# Patient Record
Sex: Male | Born: 1975 | Race: White | Hispanic: No | Marital: Married | Smoking: Former smoker
Health system: Southern US, Community
[De-identification: ages and names within clinical notes are randomized; demographics above are authoritative.]

## PROBLEM LIST (undated history)

## (undated) DIAGNOSIS — Z789 Other specified health status: Secondary | ICD-10-CM

## (undated) HISTORY — DX: Other specified health status: Z78.9

## (undated) HISTORY — PX: MANDIBLE FRACTURE SURGERY: SHX706

---

## 2008-08-18 ENCOUNTER — Emergency Department: Payer: Self-pay | Admitting: Internal Medicine

## 2008-10-13 ENCOUNTER — Emergency Department: Payer: Self-pay | Admitting: Emergency Medicine

## 2017-06-07 ENCOUNTER — Emergency Department (HOSPITAL_COMMUNITY): Payer: BLUE CROSS/BLUE SHIELD

## 2017-06-07 ENCOUNTER — Observation Stay (HOSPITAL_COMMUNITY)
Admission: EM | Admit: 2017-06-07 | Discharge: 2017-06-08 | Disposition: A | Discharge status: Home / Self Care | Payer: BLUE CROSS/BLUE SHIELD | Attending: Internal Medicine | Admitting: Internal Medicine

## 2017-06-07 ENCOUNTER — Observation Stay (HOSPITAL_COMMUNITY): Payer: BLUE CROSS/BLUE SHIELD

## 2017-06-07 ENCOUNTER — Other Ambulatory Visit: Payer: Self-pay

## 2017-06-07 ENCOUNTER — Encounter (HOSPITAL_COMMUNITY): Payer: Self-pay | Admitting: *Deleted

## 2017-06-07 DIAGNOSIS — R531 Weakness: Secondary | ICD-10-CM | POA: Insufficient documentation

## 2017-06-07 DIAGNOSIS — F17228 Nicotine dependence, chewing tobacco, with other nicotine-induced disorders: Secondary | ICD-10-CM | POA: Diagnosis not present

## 2017-06-07 DIAGNOSIS — R110 Nausea: Secondary | ICD-10-CM | POA: Insufficient documentation

## 2017-06-07 DIAGNOSIS — F1210 Cannabis abuse, uncomplicated: Secondary | ICD-10-CM | POA: Insufficient documentation

## 2017-06-07 DIAGNOSIS — R002 Palpitations: Secondary | ICD-10-CM | POA: Diagnosis present

## 2017-06-07 DIAGNOSIS — R61 Generalized hyperhidrosis: Secondary | ICD-10-CM | POA: Diagnosis not present

## 2017-06-07 DIAGNOSIS — D696 Thrombocytopenia, unspecified: Secondary | ICD-10-CM

## 2017-06-07 DIAGNOSIS — Z87891 Personal history of nicotine dependence: Secondary | ICD-10-CM | POA: Diagnosis not present

## 2017-06-07 DIAGNOSIS — R079 Chest pain, unspecified: Principal | ICD-10-CM | POA: Diagnosis present

## 2017-06-07 DIAGNOSIS — R0789 Other chest pain: Secondary | ICD-10-CM | POA: Diagnosis not present

## 2017-06-07 LAB — I-STAT TROPONIN, ED: TROPONIN I, POC: 0 ng/mL (ref 0.00–0.08)

## 2017-06-07 LAB — INFLUENZA PANEL BY PCR (TYPE A & B)
INFLAPCR: NEGATIVE
INFLBPCR: NEGATIVE

## 2017-06-07 LAB — BASIC METABOLIC PANEL
ANION GAP: 8 (ref 5–15)
BUN: 10 mg/dL (ref 6–20)
CALCIUM: 9.2 mg/dL (ref 8.9–10.3)
CO2: 26 mmol/L (ref 22–32)
Chloride: 104 mmol/L (ref 101–111)
Creatinine, Ser: 1.06 mg/dL (ref 0.61–1.24)
Glucose, Bld: 101 mg/dL — ABNORMAL HIGH (ref 65–99)
Potassium: 4 mmol/L (ref 3.5–5.1)
SODIUM: 138 mmol/L (ref 135–145)

## 2017-06-07 LAB — CBC
HCT: 46.9 % (ref 39.0–52.0)
HEMOGLOBIN: 16.9 g/dL (ref 13.0–17.0)
MCH: 31.1 pg (ref 26.0–34.0)
MCHC: 36 g/dL (ref 30.0–36.0)
MCV: 86.2 fL (ref 78.0–100.0)
Platelets: 146 10*3/uL — ABNORMAL LOW (ref 150–400)
RBC: 5.44 MIL/uL (ref 4.22–5.81)
RDW: 12.4 % (ref 11.5–15.5)
WBC: 8.2 10*3/uL (ref 4.0–10.5)

## 2017-06-07 LAB — RAPID URINE DRUG SCREEN, HOSP PERFORMED
Amphetamines: NOT DETECTED
BENZODIAZEPINES: NOT DETECTED
Barbiturates: NOT DETECTED
COCAINE: NOT DETECTED
OPIATES: NOT DETECTED
Tetrahydrocannabinol: POSITIVE — AB

## 2017-06-07 LAB — TROPONIN I: Troponin I: <0.03 ng/mL (ref <0.03)

## 2017-06-07 LAB — TSH: TSH: 1.49 u[IU]/mL (ref 0.350–4.500)

## 2017-06-07 MED ORDER — ASPIRIN EC 81 MG PO TBEC
81.0000 mg | DELAYED_RELEASE_TABLET | Freq: Every day | ORAL | Status: DC
  Administered 2017-06-08: 81 mg via ORAL
  Filled 2017-06-07: qty 1

## 2017-06-07 MED ORDER — ENOXAPARIN SODIUM 40 MG/0.4ML ~~LOC~~ SOLN: 40.0000 mg | SUBCUTANEOUS | Status: DC

## 2017-06-07 MED ORDER — ENOXAPARIN SODIUM 100 MG/ML ~~LOC~~ SOLN
90.0000 mg | SUBCUTANEOUS | Status: DC
  Administered 2017-06-07: 90 mg via SUBCUTANEOUS
  Filled 2017-06-07: qty 1

## 2017-06-07 MED ORDER — ACETAMINOPHEN 325 MG PO TABS: 650.0000 mg | ORAL_TABLET | Freq: Four times a day (QID) | ORAL | Status: DC | PRN

## 2017-06-07 MED ORDER — ACETAMINOPHEN 650 MG RE SUPP: 650.0000 mg | Freq: Four times a day (QID) | RECTAL | Status: DC | PRN

## 2017-06-07 NOTE — H&P (Signed)
Date: 06/07/2017               Patient Name:  Tom Thomas MRN: [id number]  DOB: Sep 21, 1975 Age / Sex: 41 y.o., male   PCP: [facility]         Medical Service: Internal Medicine Teaching Service         Attending Physician: Dr. Gust RungHoffman, Erik C, DO    First Contact: Dr. Minda MeoLaCroce Pager: [phone]  Second Contact: Dr. Samuella CotaSvalina Pager: [phone]       After Hours (After 5p/  First Contact Pager: [phone]  weekends / holidays): Second Contact Pager: [phone]   Chief Complaint: chest pain   History of Present Illness:  41 yo male with no PMH presenting with a chief complaint of chest pain.   Patient endorses 5 day history of feeling generally weak, with lack of energy, and fatigue. Weakness and fatigue is worse with exertion. He is usually very active, but during this period of time, he has barely left the house and could not go to work this morning due to the profound weakness after just taking a shower. He also endorses feeling of heart fluttering with some intermittent substernal chest pain. He endorses chills since onset of these symptoms, also with intermittent nausea (one episode of vomiting), dizziness with positional changes, and decreased appetite (thinks he lost ~10lbs since onset of symptoms). He denies weight loss prior to symptoms onset - usual weight is ~198lbs). Episodes of palpitations, nausea, dizziness with start up and resolve suddenly. He denies fever, abd pain, diarrhea, constipation, focal weakness, numbness/tingling, upper respiratory symptoms, myalgias, urinary frequency, urgency, hematuria, headaches, vision or hearing changes. He denies recent ill contacts.  He denies known family history of heart disease; he is a former smoker (15 pack years - quit 423yrs ago); occasional EtOH use; occasional marijuana use.   ED course: Vitals: BP 103/69, HR 60, afebrile, O2sat 98% Labs: CBC remarkable for plts 149, Bmet remarkable for glu 101, istat trop  neg Meds: asa 325 prior to arrival Imaging: CXR unremarkable, EKG w/o ischemic changes.  Meds:  Current Meds  Medication Sig  . aspirin 325 MG tablet Take 325 mg by mouth daily.  . Multiple Vitamins-Minerals (MULTIVITAMIN WITH MINERALS) tablet Take 1 tablet by mouth daily.   Allergies: Allergies as of 06/07/2017  . (No Known Allergies)   History reviewed. No pertinent past medical history.  Family History: Both parents died of drug overdose (father age 2330s, mother 550s).   Social History: Married, former 15 pack year smoker (quit 143yrs ago), occasional EtOH use, occasional marijuana use  Review of Systems: A complete ROS was negative except as per HPI.   Physical Exam: Blood pressure 105/76, pulse (!) 56, temperature (!) 97.5 F (36.4 C), temperature source Oral, resp. rate 12, SpO2 95 %. GENERAL- alert, co-operative, appears as stated age, not in any distress. HEENT- Atraumatic, normocephalic, EOMI, oral mucosa appears moist; no thyromegaly, no lymphadenopathy CARDIAC- RRR, no murmurs, rubs or gallops. RESP- Moving equal volumes of air, and clear to auscultation bilaterally, no wheezes or crackles. ABDOMEN- Soft, nontender, bowel sounds present. NEURO- No obvious Cr N abnormality. Moves all 4 extremities freely EXTREMITIES- pulse 2+, symmetric, no pedal edema. SKIN- Warm, dry, no rash or lesion. PSYCH- Normal mood and affect, appropriate thought content and speech.  EKG: personally reviewed my interpretation is SR, no ST segment depression or elevation, no T wave inversions  CXR: personally reviewed my interpretation is no pulmonary congestion, infiltrates,  pleural effusion  Assessment & Plan by Problem: Principal Problem:   Palpitation Active Problems:   Chest pain  Palpitations, weakness, nausea: Initially, his presentation sounds like a viral illness with sudden onset 5 days ago, however new onset, symptomatic palpitations are concerning. He has no other s/sx of  cathecholamine access (no abd pain, diarrhea, headaches). His BP was soft and in setting of reported poor PO intake, he may be slightly volume down despite reassuring labs. --telemetry to monitor for arrhythmia --f/u orthostatic VS --trend trops --f/u TSH, flu panel --f/u Echo  Atypical chest pain: Intermittent, mild, at times sharp, left sided chest pain sometimes associated with exertion. Heart score of 1. EKG and initial iStat troponin negative. --s/p ASA 325mg  --trend troponins --f/u Echo --f/u AM lipid panel  Dispo: Admit patient to Observation with expected length of stay less than 2 midnights.  Signed: Nyra MarketSvalina, Michah Minton, MD 06/07/2017, 1:20 PM  Pager: [phone]

## 2017-06-07 NOTE — Progress Notes (Signed)
Patient admitted to 6E10 from ED via stretcher.  Bed in low position, wheels locked.  Patient denies chest pain/shortness of breath.  Telemetry monitor applied.  Patient oriented to environment, including call bell, TV, meal times, and hourly rounding.  Family at bedside.  Droplet precautions initiated per order--awaiting RVP results. Explained to family.

## 2017-06-07 NOTE — ED Provider Notes (Signed)
[facility] Provider Note   CSN: [id number] Arrival date & time: 06/07/17  [id number]     History   Chief Complaint Chief Complaint  Patient presents with  . Chest Pain    HPI Tom Thomas is a 41 y.o. male.  Complains of left anterior chest pain nonradiating onset 5 days ago discomfort is intermittent feels dull and "like a bubbling, symptoms worse with exertion and improved with rest associated symptoms include nausea sweatiness and generalized weakness.  He is presently asymptomatic.  Treated himself with aspirin 325 mg this morning he was seen in urgent care center last week had blood work which he reports was "normal" referred to his family physician who suggested that he come to the emergency department for further evaluation.  HPI Past medical history negative History reviewed. No pertinent past medical history.  There are no active problems to display for this patient.   Past Surgical History:  Procedure Laterality Date  . MANDIBLE FRACTURE SURGERY         Home Medications    Prior to Admission medications   Not on File    Family History No family history on file. Family history unknown to patient his father died at age 41 in a motor vehicle crash he did not know his mother well Social History Social History   Tobacco Use  . Smoking status: Former Games developermoker  . Smokeless tobacco: Current User    Types: Chew  Substance Use Topics  . Alcohol use: Not on file    Comment: light  . Drug use: Yes    Types: Marijuana   Smoking 5 years ago positive marijuana use no other drug use  Allergies   Patient has no known allergies.   Review of Systems Review of Systems  Constitutional: Positive for diaphoresis.  HENT: Negative.   Respiratory: Positive for shortness of breath.   Cardiovascular: Positive for chest pain.  Gastrointestinal: Positive for nausea.  Musculoskeletal: Negative.   Skin: Negative.   Neurological:  Positive for weakness.       Generalized weakness  Psychiatric/Behavioral: Negative.   All other systems reviewed and are negative.    Physical Exam Updated Vital Signs BP 112/81 (BP Location: Right Arm)   Pulse 74   Temp (!) 97.5 F (36.4 C) (Oral)   Resp 18   SpO2 99%   Physical Exam  Constitutional: He appears well-developed and well-nourished.  HENT:  Head: Normocephalic and atraumatic.  Eyes: Conjunctivae are normal. Pupils are equal, round, and reactive to light.  Neck: Neck supple. No tracheal deviation present. No thyromegaly present.  Cardiovascular: Normal rate and regular rhythm.  No murmur heard. Pulmonary/Chest: Effort normal and breath sounds normal.  Abdominal: Soft. Bowel sounds are normal. He exhibits no distension. There is no tenderness.  Musculoskeletal: Normal range of motion. He exhibits no edema or tenderness.  Neurological: He is alert. Coordination normal.  Skin: Skin is warm and dry. No rash noted.  Psychiatric: He has a normal mood and affect.  Nursing note and vitals reviewed.    ED Treatments / Results  Labs (all labs ordered are listed, but only abnormal results are displayed) Labs Reviewed  BASIC METABOLIC PANEL  CBC  I-STAT TROPONIN, ED    EKG  EKG Interpretation  Date/Time:  Wednesday June 07 2017 09:28:46 EST Ventricular Rate:  74 PR Interval:  114 QRS Duration: 88 QT Interval:  360 QTC Calculation: 399 R Axis:   65 Text Interpretation:  Normal sinus  rhythm with sinus arrhythmia Normal ECG No old tracing to compare Confirmed by JustinJacubowitz, Doreatha MartinSam [phone]([id number]) on 06/07/2017 9:44:39 AM       Radiology No results found.  Procedures Procedures (including critical care time)  Medications Ordered in ED Medications - No data to display Chest x-ray reviewed by me Results for orders placed or performed during the hospital encounter of 06/07/17  Basic metabolic panel  Result Value Ref Range   Sodium 138 135 - 145 mmol/L    Potassium 4.0 3.5 - 5.1 mmol/L   Chloride 104 101 - 111 mmol/L   CO2 26 22 - 32 mmol/L   Glucose, Bld 101 (H) 65 - 99 mg/dL   BUN 10 6 - 20 mg/dL   Creatinine, Ser 7.821.06 0.61 - 1.24 mg/dL   Calcium 9.2 8.9 - 95.610.3 mg/dL   GFR calc non Af Amer >60 >60 mL/min   GFR calc Af Amer >60 >60 mL/min   Anion gap 8 5 - 15  CBC  Result Value Ref Range   WBC 8.2 4.0 - 10.5 K/uL   RBC 5.44 4.22 - 5.81 MIL/uL   Hemoglobin 16.9 13.0 - 17.0 g/dL   HCT 21.346.9 08.639.0 - 57.852.0 %   MCV 86.2 78.0 - 100.0 fL   MCH 31.1 26.0 - 34.0 pg   MCHC 36.0 30.0 - 36.0 g/dL   RDW 46.912.4 62.911.5 - 52.815.5 %   Platelets 146 (L) 150 - 400 K/uL  I-stat troponin, ED  Result Value Ref Range   Troponin i, poc 0.00 0.00 - 0.08 ng/mL   Comment 3           Dg Chest 2 View  Result Date: 06/07/2017 CLINICAL DATA:  Chest pain EXAM: CHEST  2 VIEW COMPARISON:  None. FINDINGS: Heart and mediastinal contours are within normal limits. No focal opacities or effusions. No acute bony abnormality. IMPRESSION: No active cardiopulmonary disease. Electronically Signed   By: Charlett NoseKevin  Dover M.D.   On: 06/07/2017 10:35  Chest x-ray reviewed by me  Initial Impression / Assessment and Plan / ED Course  I have reviewed the triage vital signs and the nursing notes.  Pertinent labs & imaging results that were available during my care of the patient were reviewed by me and considered in my medical decision making (see chart for details).    Heart score equals 2 however patient has convincing story for angina.  Internal medicine resident physician consulted to arrange for overnight stay Final Clinical Impressions(s) / ED Diagnoses  Diagnosis exertional chest pain Final diagnoses:  None    ED Discharge Orders    None       Doug SouJacubowitz, Arlinda Barcelona, MD 06/07/17 1148

## 2017-06-07 NOTE — ED Notes (Signed)
ED Provider at bedside. 

## 2017-06-07 NOTE — ED Notes (Signed)
Patient transported to X-ray 

## 2017-06-07 NOTE — ED Triage Notes (Signed)
Pt states Friday morning got nauseated and weak and has felt this way since.  Pt reports heart fluttering and gets pain at bottom of left chest.

## 2017-06-08 ENCOUNTER — Observation Stay (HOSPITAL_COMMUNITY): Payer: BLUE CROSS/BLUE SHIELD

## 2017-06-08 ENCOUNTER — Encounter (HOSPITAL_COMMUNITY): Payer: Self-pay | Admitting: General Practice

## 2017-06-08 DIAGNOSIS — R002 Palpitations: Secondary | ICD-10-CM

## 2017-06-08 DIAGNOSIS — R0789 Other chest pain: Secondary | ICD-10-CM | POA: Diagnosis not present

## 2017-06-08 DIAGNOSIS — R110 Nausea: Secondary | ICD-10-CM | POA: Diagnosis not present

## 2017-06-08 DIAGNOSIS — R61 Generalized hyperhidrosis: Secondary | ICD-10-CM | POA: Diagnosis not present

## 2017-06-08 DIAGNOSIS — R079 Chest pain, unspecified: Secondary | ICD-10-CM | POA: Diagnosis not present

## 2017-06-08 DIAGNOSIS — R531 Weakness: Secondary | ICD-10-CM | POA: Diagnosis not present

## 2017-06-08 LAB — HEPATIC FUNCTION PANEL
ALT: 14 U/L — ABNORMAL LOW (ref 17–63)
AST: 18 U/L (ref 15–41)
Albumin: 3.9 g/dL (ref 3.5–5.0)
Alkaline Phosphatase: 70 U/L (ref 38–126)
BILIRUBIN DIRECT: 0.1 mg/dL (ref 0.1–0.5)
BILIRUBIN INDIRECT: 1.1 mg/dL — AB (ref 0.3–0.9)
TOTAL PROTEIN: 6.8 g/dL (ref 6.5–8.1)
Total Bilirubin: 1.2 mg/dL (ref 0.3–1.2)

## 2017-06-08 LAB — CBC
HEMATOCRIT: 45.4 % (ref 39.0–52.0)
Hemoglobin: 16.4 g/dL (ref 13.0–17.0)
MCH: 31.2 pg (ref 26.0–34.0)
MCHC: 36.1 g/dL — ABNORMAL HIGH (ref 30.0–36.0)
MCV: 86.5 fL (ref 78.0–100.0)
PLATELETS: 140 10*3/uL — AB (ref 150–400)
RBC: 5.25 MIL/uL (ref 4.22–5.81)
RDW: 12.5 % (ref 11.5–15.5)
WBC: 8.8 10*3/uL (ref 4.0–10.5)

## 2017-06-08 LAB — LIPID PANEL
CHOL/HDL RATIO: 7.3 ratio
Cholesterol: 182 mg/dL (ref 0–200)
HDL: 25 mg/dL — AB (ref >40)
LDL Cholesterol: 120 mg/dL — ABNORMAL HIGH (ref 0–99)
TRIGLYCERIDES: 185 mg/dL — AB (ref <150)
VLDL: 37 mg/dL (ref 0–40)

## 2017-06-08 LAB — MONONUCLEOSIS SCREEN: Mono Screen: NEGATIVE

## 2017-06-08 LAB — BASIC METABOLIC PANEL
ANION GAP: 9 (ref 5–15)
BUN: 10 mg/dL (ref 6–20)
CALCIUM: 8.9 mg/dL (ref 8.9–10.3)
CO2: 24 mmol/L (ref 22–32)
Chloride: 104 mmol/L (ref 101–111)
Creatinine, Ser: 1 mg/dL (ref 0.61–1.24)
GLUCOSE: 98 mg/dL (ref 65–99)
POTASSIUM: 3.9 mmol/L (ref 3.5–5.1)
Sodium: 137 mmol/L (ref 135–145)

## 2017-06-08 LAB — ECHOCARDIOGRAM COMPLETE
Height: 73.5 in
Weight: 2934.4 oz

## 2017-06-08 LAB — HEMOGLOBIN A1C
HEMOGLOBIN A1C: 5 % (ref 4.8–5.6)
Mean Plasma Glucose: 96.8 mg/dL

## 2017-06-08 LAB — HIV ANTIBODY (ROUTINE TESTING W REFLEX): HIV SCREEN 4TH GENERATION: NONREACTIVE

## 2017-06-08 MED ORDER — ONDANSETRON 4 MG PO TBDP: 4.0000 mg | ORAL_TABLET | Freq: Three times a day (TID) | ORAL | Status: DC | PRN

## 2017-06-08 MED ORDER — ONDANSETRON 4 MG PO TBDP
4.0000 mg | ORAL_TABLET | Freq: Once | ORAL | Status: DC
  Filled 2017-06-08: qty 1

## 2017-06-08 MED ORDER — ONDANSETRON HCL 4 MG PO TABS: 4.0000 mg | ORAL_TABLET | Freq: Three times a day (TID) | ORAL | 0 refills | Status: DC | PRN

## 2017-06-08 MED ORDER — ONDANSETRON HCL 4 MG PO TABS: 4.0000 mg | ORAL_TABLET | Freq: Three times a day (TID) | ORAL | 0 refills | Status: AC | PRN

## 2017-06-08 MED ORDER — PERFLUTREN LIPID MICROSPHERE
1.0000 mL | INTRAVENOUS | Status: AC | PRN
  Administered 2017-06-08: 2 mL via INTRAVENOUS

## 2017-06-08 MED ORDER — ASPIRIN 81 MG PO TBEC: 81.0000 mg | DELAYED_RELEASE_TABLET | Freq: Every day | ORAL | Status: AC

## 2017-06-08 MED ORDER — ONDANSETRON 4 MG PO TBDP
4.0000 mg | ORAL_TABLET | Freq: Three times a day (TID) | ORAL | Status: DC | PRN
  Filled 2017-06-08: qty 1

## 2017-06-08 MED ORDER — ONDANSETRON HCL 4 MG PO TABS: 4.0000 mg | ORAL_TABLET | Freq: Three times a day (TID) | ORAL | Status: DC | PRN

## 2017-06-08 MED ORDER — ONDANSETRON HCL 4 MG PO TABS
4.0000 mg | ORAL_TABLET | Freq: Once | ORAL | Status: AC
  Administered 2017-06-08: 4 mg via ORAL
  Filled 2017-06-08: qty 1

## 2017-06-08 NOTE — Progress Notes (Signed)
   Subjective: Patient seen and examined. He is accompanied by his wife this AM. He still feels very fatigued and nauseated this morning. Did not have any more chest pain overnight. Had an episode of heart racing last night. His wife is very concerned about his fatigue. He denies recent exposure to ticks, joint pains, or recent travel.  Dicussed that his work up has thus far been negative, and this most likely is due to a viral illness.   Objective:  Vital signs in last 24 hours: Vitals:   06/07/17 1400 06/07/17 1432 06/07/17 2015 06/08/17 0536  BP: 115/76 109/81 103/66 105/67  Pulse: (!) 58 75 69 60  Resp:  17 16 14   Temp:  98 F (36.7 C) 98.1 F (36.7 C) 97.6 F (36.4 C)  TempSrc:  Oral Oral Oral  SpO2: 99% 99% 100% 99%  Weight:  183 lb 8 oz (83.2 kg)  183 lb 6.4 oz (83.2 kg)  Height:  6' 1.5" (1.867 m)     General: Laying in bed comfortably, NAD HEENT: [place]/AT, EOMI, no scleral icterus, PERRL Cardiac: RRR, No R/M/G appreciated Pulm: normal effort, CTAB Abd: soft, non tender, non distended, BS normal Ext: extremities well perfused, no peripheral edema Neuro: alert and oriented X3, cranial nerves II-XII grossly intact   Assessment/Plan:  Principal Problem:   Palpitation Active Problems:   Chest pain  Generalized Weakness, Palpitations, intermittent Chest pain  Afebrile, vital signs stable. BMET and CBC have been within normal limits, only thing remarkable is mildly decreased platelets.  Patient most likely has viral illness. Influenza panel is negative, TSH was within normal limits, HIV negative. Cardiac work up was negative, Troponin negative x 2, EKG without ischemic changes. Had PACs on tele, but was sinus rhythm, with no abnormal arrythmias noted. Echo complete, final read pending. Cardiology evaluated the patient and commented on the echo: preliminary read with normal EF, no structural or valvular abnormalities, no vegetations.  -Cardiology agrees he does not warrant  further inpatient ischemic work up - can consider outpatient stress test vx coronary CTA if symptoms persist -Will get monospot and parvo virus serology  -HIV pending  -A1C pending -Encouraged PCP follow up if no resolution of symptoms  Nausea -Zofran q8 hours PRN    Dispo: Anticipated discharge today after final read of ECHO.   Toney RakesLacroce, Gianni Fuchs J, MD 06/08/2017, 12:02 PM Pager: [phone]

## 2017-06-08 NOTE — Consult Note (Signed)
Cardiology Consultation:   Patient ID: Tom Thomas; [id number]; 1975/11/11   Admit date: 06/07/2017 Date of Consult: 06/08/2017  [facility] Primary Cardiologist: None   Patient Profile:   Tom Thomas is a 41 y.o. male with no PMH who is being seen today for the evaluation of palpitations and chest pain at the request of Dr. Mikey BussingHoffman.  History of Present Illness:   Tom Thomas is a healthy 41yo M with no PMH who [facility] with complaints of generalized weakness and fatigue x5 days. Patient states he was in his usual state of health until 5 days ago when he experienced acute onset of generalized weakness and fatigue associated with some positional dizziness, nausea, and decreased appetite. States he has not had energy to leave the house or go to work. Has noticed a 10lb weight loss since onset of symptoms. Presented to an outside hospital on 06/05/17 and reportedly had blood work done which was "normal". Symptoms persisted, and he experienced profound weakness after taking a shower on 06/07/17 and decided to return to ED.   Patient also with complaints of intermittent "bubbling" sensation in his chest over the past week which is occasionally associated with a sharp L lower mid-axillary pain. Patient states the sensation lasts seconds-minutes and resolve spontaneously. He is not able to correlate these symptoms with physical activity. He denies associated SOB, N/V, diaphoresis, dizziness, or radiation of pain. He is normally an active person, golfing 1-2x per week and does not experience anginal symptoms. Denies orthopnea, PND, LE edema. Patient does endorse drinking 2-3 bottles of soda a day.  He denies family history of heart disease, however both parents died at early ages (father- car accident in early 7230s, mother- drug overdose in 4550s). Heart disease risk factors include smoking history (15pack year; quit 10 years ago). Denies  history of HTN, DM, HLD. ROS negative for fever, chills, abdominal pain, diarrhea, urinary symptoms, recent sick contact, recent URI symptoms.   Hospital course: afebrile, HR generally in the 60s (max 113), BP 100/65-115-82, O2 99%, RR normal. Troponin neg x2; TSH wnl; Hgb 16.4, WBC 8.8, PLT 140, Cr 1.00; electrolytes wnl. Influenza negative. CXR with no acute findings. EKG with NSR with occasional PACs, no STE/D, no TWI   History reviewed. No pertinent past medical history.  Past Surgical History:  Procedure Laterality Date  . MANDIBLE FRACTURE SURGERY        Inpatient Medications: Scheduled Meds: . aspirin EC  81 mg Oral Daily  . enoxaparin (LOVENOX) injection  90 mg Subcutaneous Q24H   Continuous Infusions:  PRN Meds: acetaminophen **OR** acetaminophen  Allergies:   No Known Allergies  Social History:   Social History   Socioeconomic History  . Marital status: Married    Spouse name: Not on file  . Number of children: Not on file  . Years of education: Not on file  . Highest education level: Not on file  Social Needs  . Financial resource strain: Not on file  . Food insecurity - worry: Not on file  . Food insecurity - inability: Not on file  . Transportation needs - medical: Not on file  . Transportation needs - non-medical: Not on file  Occupational History  . Not on file  Tobacco Use  . Smoking status: Former Games developermoker  . Smokeless tobacco: Current User    Types: Chew  Substance and Sexual Activity  . Alcohol use: Not on file    Comment: light  .  Drug use: Yes    Types: Marijuana  . Sexual activity: Not on file  Other Topics Concern  . Not on file  Social History Narrative  . Not on file    Family History:   - Father deceased in early 21s 2/2 car accident - Mother deceased in 69s 2/2 drug overdose  ROS:  Please see the history of present illness.   All other ROS reviewed and negative.     Physical Exam/Data:   Vitals:   06/07/17 1400 06/07/17  1432 06/07/17 2015 06/08/17 0536  BP: 115/76 109/81 103/66 105/67  Pulse: (!) 58 75 69 60  Resp:  17 16 14   Temp:  98 F (36.7 C) 98.1 F (36.7 C) 97.6 F (36.4 C)  TempSrc:  Oral Oral Oral  SpO2: 99% 99% 100% 99%  Weight:  (!) 404 lb 8.7 oz (183.5 kg)  183 lb 6.4 oz (83.2 kg)  Height:  6' 1.5" (1.867 m)      Intake/Output Summary (Last 24 hours) at 06/08/2017 0903 Last data filed at 06/07/2017 2300 Gross per 24 hour  Intake 960 ml  Output 300 ml  Net 660 ml   Filed Weights   06/07/17 1432 06/08/17 0536  Weight: (!) 404 lb 8.7 oz (183.5 kg) 183 lb 6.4 oz (83.2 kg)   Body mass index is 23.87 kg/m.  General:  Well nourished, well developed, in no acute distress HEENT: sclera anicteric  Lymph: no adenopathy Neck: no JVD Endocrine:  No thryomegaly Vascular: No carotid bruits; distal pulses 2+ bilaterally   Cardiac:  normal S1, S2; RRR; no murmur Lungs:  clear to auscultation bilaterally, no wheezing, rhonchi or rales  Abd: NABS, soft, nontender, no hepatomegaly  Ext: no edema Musculoskeletal:  No deformities, BUE and BLE strength normal and equal Skin: warm and dry  Neuro:  CNs 2-12 intact, no focal abnormalities noted Psych:  Normal affect   EKG:  The EKG was personally reviewed and demonstrates:  NSR, isolated TWI V3, otherwise no STE/D Telemetry:  Telemetry was personally reviewed and demonstrates:  NSR with occasional PACs  Relevant CV Studies: Echo pending  Laboratory Data:  Chemistry Recent Labs  Lab 06/07/17 0945 06/08/17 0442  NA 138 137  K 4.0 3.9  CL 104 104  CO2 26 24  GLUCOSE 101* 98  BUN 10 10  CREATININE 1.06 1.00  CALCIUM 9.2 8.9  GFRNONAA >60 >60  GFRAA >60 >60  ANIONGAP 8 9    No results for input(s): PROT, ALBUMIN, AST, ALT, ALKPHOS, BILITOT in the last 168 hours. Hematology Recent Labs  Lab 06/07/17 0945 06/08/17 0442  WBC 8.2 8.8  RBC 5.44 5.25  HGB 16.9 16.4  HCT 46.9 45.4  MCV 86.2 86.5  MCH 31.1 31.2  MCHC 36.0 36.1*    RDW 12.4 12.5  PLT 146* 140*   Cardiac Enzymes Recent Labs  Lab 06/07/17 1300  TROPONINI <0.03    Recent Labs  Lab 06/07/17 1015  TROPIPOC 0.00    BNPNo results for input(s): BNP, PROBNP in the last 168 hours.  DDimer No results for input(s): DDIMER in the last 168 hours.  Radiology/Studies:  Dg Chest 2 View  Result Date: 06/07/2017 CLINICAL DATA:  Chest pain EXAM: CHEST  2 VIEW COMPARISON:  None. FINDINGS: Heart and mediastinal contours are within normal limits. No focal opacities or effusions. No acute bony abnormality. IMPRESSION: No active cardiopulmonary disease. Electronically Signed   By: Charlett Nose M.D.   On: 06/07/2017 10:35  Assessment and Plan:   1. Palpitations/atypical CP: Symptoms are atypical for ACS, in that he does not experience CP or palpitations with exertion and they are not associated with SOB, dizziness, diaphoresis, radiation of pain.  - Troponin negative x2, EKG non-ischemic - Telemetry with occasional PACs - patient could be experiencing symptomatic PACs. No other arrhythmias noted on telemetry.  - Preliminary Echo read with normal EF, no structural or valvular abnormalities, no vegetations - Risk Stratification: Lipid profile with total cholesterol 182, HLD 25, LDL 120, triglycerides 185.  Will check Hgb A1C - Does not warrant further inpatient ischemic work-up. Can consider outpatient stress test vs Coronary CTA if symptoms persist after resolution of presumed viral illness. Patient can call the office if symptoms persist to coordinate further testing/outpatient follow-up.    2. Weakness, dizziness, nausea - symptoms sound suspicious for a viral etiology - Influenza and HIV negative - Utox + marijuana use  - Continue supportive care per primary team  For questions or updates, please contact [facility] Please consult [email or web address] for contact info under Cardiology/STEMI.   Signed, Beatriz StallionKrista M. Kroeger, PA-C  06/08/2017 9:03  AM  Personally seen and examined. Agree with above.  41 year old male brought in for observation secondary to several days of weakness, weight loss, decreased appetite and transient atypical chest pain described as a bubbling in his stomach.  All lab work has been unremarkable.  Flu panel was negative.  ECG was unremarkable .  Occasional PACs noted on telemetry.  Echocardiogram by preliminary read looks normal as well.  No evidence of vegetation valvular abnormalities and normal EF present.  Atypical chest pain/lethargy -No further cardiac workup at this time.  Symptoms seem to be consistent with potential viral syndrome.  He feels slightly better this morning.  Chest discomfort appears atypical and likely noncardiac.  If after he is over his current illness still having chest discomfort, we can always pursue exercise treadmill test for further evaluation as an outpatient.  He may call the clinic if necessary. - Will add liver function studies to his blood work.  To make sure that there is no general evidence of hepatitis.  We will sign off.  Please let us know if we can be of further assistance. Donato SchultzMark Jibril Mcminn, MD

## 2017-06-08 NOTE — Progress Notes (Signed)
  Echocardiogram 2D Echocardiogram has been performed.  Tom [name] 06/08/2017, 10:33 AM

## 2017-06-09 LAB — PARVOVIRUS B19 ANTIBODY, IGG AND IGM
PAROVIRUS B19 IGG ABS: 0.1 {index} (ref 0.0–0.8)
PAROVIRUS B19 IGM ABS: 0.3 {index} (ref 0.0–0.8)

## 2017-06-09 NOTE — Discharge Summary (Signed)
Name: Tom Thomas MRN: [id number] DOB: 07/02/1975 41 y.o. PCP: Alanson Puls, [facility]  Date of Admission: 06/07/2017  9:41 AM Date of Discharge: 06/08/2017 Attending Physician: No att. providers found  Discharge Diagnosis: 1.Palpitations Principal Problem:   Palpitation Active Problems:   Chest pain   Discharge Medications: Allergies as of 06/08/2017   No Known Allergies     Medication List    STOP taking these medications   aspirin 325 MG tablet Replaced by:  aspirin 81 MG EC tablet     TAKE these medications   aspirin 81 MG EC tablet Take 1 tablet (81 mg total) by mouth daily. Replaces:  aspirin 325 MG tablet   multivitamin with minerals tablet Take 1 tablet by mouth daily.   ondansetron 4 MG tablet Commonly known as:  ZOFRAN Take 1 tablet (4 mg total) by mouth every 8 (eight) hours as needed for nausea or vomiting.       Disposition and follow-up:   Tom Thomas was discharged from [facility] in Good condition.  At the hospital follow up visit please address:  1.    Chest pain, Palpitations -Recurrent symptoms? -Recommend outpatient holter monitor, stress test, or cardiac CT if not resolved -May need Cardiology referral -Troponin negative x2, EKG without ischemic changes -ECHO with normal systolic and diastolic function  Generalized Weakness -Resolution of symptoms? -CBC and basic metabolic panel were normal -Viral work up was negative for: influenza, HIV -LFTs were within normal limits -TSH was within normal limits  Nausea -Resolution of symptoms?    2.  Labs / imaging needed at time of follow-up: none  3.  Pending labs/ test needing follow-up: Parvovirus serology  Follow-up Appointments:   Hospital Course by problem list: Principal Problem:   Palpitation Active Problems:   Chest pain   1. Chest pain, Palpitations Tom Thomas was admitted to [facility] and the Internal Medicine Teaching  Service for chest pain and ACS rule out. Upon arrival to the [facility], his vital signs were stable and he was without laboratory abnormality. EKG was without ischemic changes. Troponin trend was negative x 2. Chest xray had no evidence of acte cardiopulmonary disease. Cardiac telemetry was worn for ~24 hours and no conduction abnormalities were noted. PACs were noted. Cardiology evaluated the patient and did not feel he needed further inpatient cardiac evaluation. If he has no resolution in symptoms he may benefit from outpatient cardiology evaluation. The patient had no episodes of chest pain while hospitalized, but did have palpitations over night. An echocardiogram was completed and  Showed normal systolic and diastolic function with an estimated EF of 55-60%.  Generalized Weakness There were no abnormalities in the patient's labs or imaging that were identified that could explain the patient's generalized weakness. CBC, TSH, BMET, and hepatic function panel were within normal limits. Influenza and HIV were negative. Monospot screen was negative. Parvovirus serologies were pending at discharge.   Nausea Patient had recurrent episodes of nausea without vomiting. He was given Zofran for symptom management, which did improve his nausea. He was discharged with a prescription.    Discharge Vitals:   BP 106/72 (BP Location: Right Arm)   Pulse 64   Temp 97.9 F (36.6 C) (Oral)   Resp 16   Ht 6' 1.5" (1.867 m)   Wt 183 lb 6.4 oz (83.2 kg)   SpO2 98%   BMI 23.87 kg/m   Pertinent Labs, Studies, and Procedures:  CBC Latest Ref Rng & Units 06/08/2017  06/07/2017  WBC 4.0 - 10.5 K/uL 8.8 8.2  Hemoglobin 13.0 - 17.0 g/dL 16.4 16.9  Hematocrit 39.0 - 52.0 % 45.4 46.9  Platelets 150 - 400 K/uL 140(L) 146(L)   BMP Latest Ref Rng & Units 06/08/2017 06/07/2017  Glucose 65 - 99 mg/dL 98 101(H)  BUN 6 - 20 mg/dL 10 10  Creatinine 0.61 - 1.24 mg/dL 1.00 1.06  Sodium 135 - 145 mmol/L 137 138  Potassium 3.5 -  5.1 mmol/L 3.9 4.0  Chloride 101 - 111 mmol/L 104 104  CO2 22 - 32 mmol/L 24 26  Calcium 8.9 - 10.3 mg/dL 8.9 9.2   Hepatic Function Latest Ref Rng & Units 06/08/2017  Total Protein 6.5 - 8.1 g/dL 6.8  Albumin 3.5 - 5.0 g/dL 3.9  AST 15 - 41 U/L 18  ALT 17 - 63 U/L 14(L)  Alk Phosphatase 38 - 126 U/L 70  Total Bilirubin 0.3 - 1.2 mg/dL 1.2  Bilirubin, Direct 0.1 - 0.5 mg/dL 0.1   LV EF: 55% -   60%  ------------------------------------------------------------------- Indications:      Palpitations 785.1.  ------------------------------------------------------------------- History:   PMH:   Chest pain.  Risk factors:  Current tobacco use.   ------------------------------------------------------------------- Study Conclusions  - Left ventricle: The cavity size was normal. Systolic function was   normal. The estimated ejection fraction was in the range of 55%   to 60%. Wall motion was normal; there were no regional wall   motion abnormalities. - Pericardium, extracardiac: A trivial pericardial effusion was   identified.   ------------------------------------------------------------------- Left ventricle:  The cavity size was normal. Systolic function was normal. The estimated ejection fraction was in the range of 55% to 60%. Wall motion was normal; there were no regional wall motion abnormalities.  ------------------------------------------------------------------- Aortic valve:   Trileaflet; normal thickness leaflets. Mobility was not restricted.  Doppler:  Transvalvular velocity was within the normal range. There was no stenosis. There was no regurgitation.   ------------------------------------------------------------------- Aorta:  Aortic root: The aortic root was normal in size.  ------------------------------------------------------------------- Mitral valve:   Structurally normal valve.   Mobility was not restricted.  Doppler:  Transvalvular velocity was  within the normal range. There was no evidence for stenosis. There was no regurgitation.  ------------------------------------------------------------------- Left atrium:  The atrium was normal in size.  ------------------------------------------------------------------- Right ventricle:  The cavity size was normal. Wall thickness was normal. Systolic function was normal.  ------------------------------------------------------------------- Pulmonic valve:   Not well visualized.  The valve appears to be grossly normal.    Doppler:  Transvalvular velocity was within the normal range. There was no evidence for stenosis.  ------------------------------------------------------------------- Tricuspid valve:   Structurally normal valve.    Doppler: Transvalvular velocity was within the normal range. There was trivial regurgitation.  ------------------------------------------------------------------- Right atrium:  The atrium was normal in size.  ------------------------------------------------------------------- Pericardium:  A trivial pericardial effusion was identified.  ------------------------------------------------------------------- Systemic veins: Inferior vena cava: The vessel was normal in size.  -------------------------------------------------------------------   Discharge Instructions: Discharge Instructions    Call MD for:  persistant nausea and vomiting   Complete by:  As directed    Call MD for:  severe uncontrolled pain   Complete by:  As directed    Call MD for:  temperature >100.4   Complete by:  As directed    Diet - low sodium heart healthy   Complete by:  As directed    Discharge instructions   Complete by:  As directed    Tom Thomas,  All testing we have done in the hospital has come back normal. Your labs look great. Your blood count is normal, you have no signs of anemia. Your electrolytes are within normal limits. We checked your thyroid  function, which was also normal. Liver function tests were also normal. We tested you for several viruses: Influenza - negative; HIV - negative; Mono - negative. Your inpatient cardiac work up was normal. You did not have a heart attack. We monitored your heart rate and did not see any abnormal rhythms. The final read of the ultrasound (echocardiogram) of your heart was pending on discharge. The cardiologist looked at the echocardiogram and preliminary stated that there was no abnormalities, the heart is functioning normally. We will call you with the final results.   I have prescribed you a weeks worth of anti-nausea medication called Zofran. Take 1 tablet every 8 hours as needed for nausea or vomiting.   Please follow up with a primary care provider within 1-2 weeks of discharge from the hospital. If your symptoms persist your primary care doctor will determine what are the next best steps.   Increase activity slowly   Complete by:  As directed       Signed: Melanee Spry, MD 06/09/2017, 11:23 AM   Pager: [phone]

## 2018-07-03 IMAGING — DX DG CHEST 2V
2 series · 2 of 2 positions shown · non-contrast
Comparison: None.

CLINICAL DATA: Chest pain

EXAM:
CHEST  2 VIEW

[chest lat]
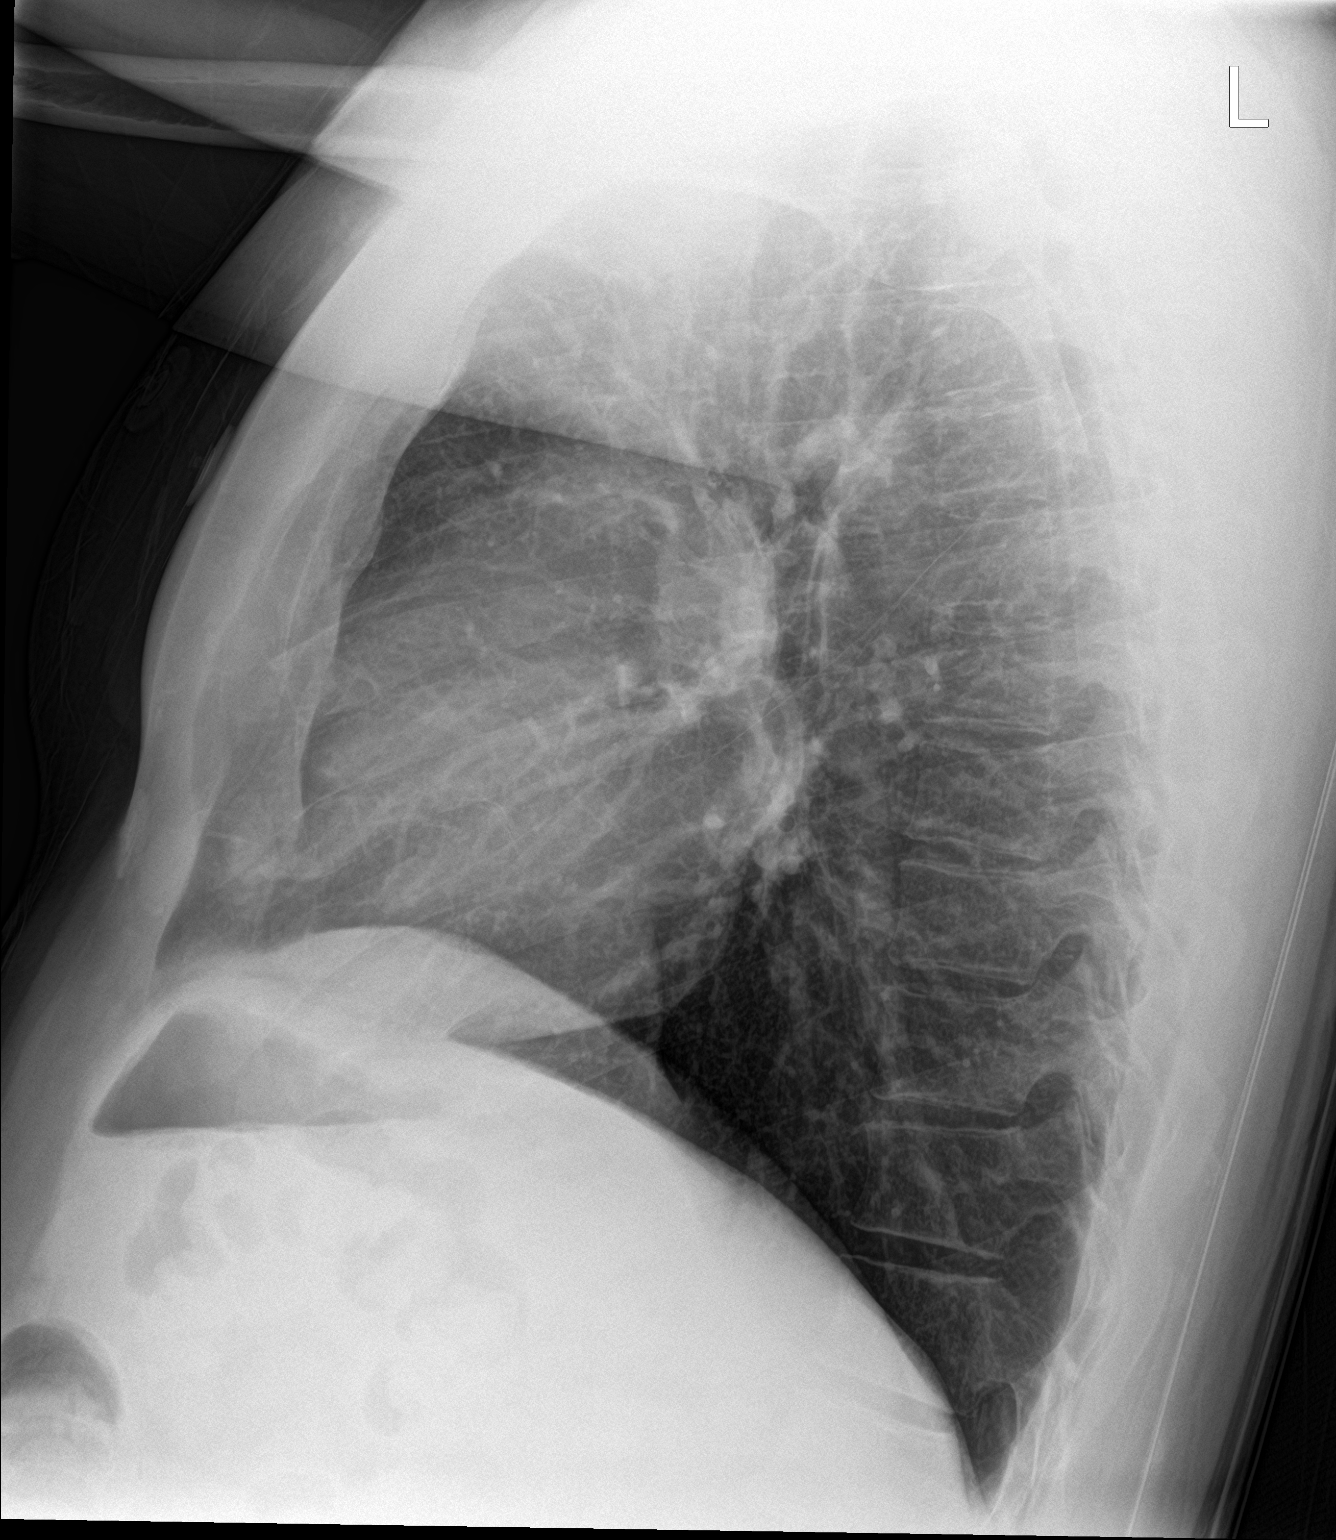

[chest ap]
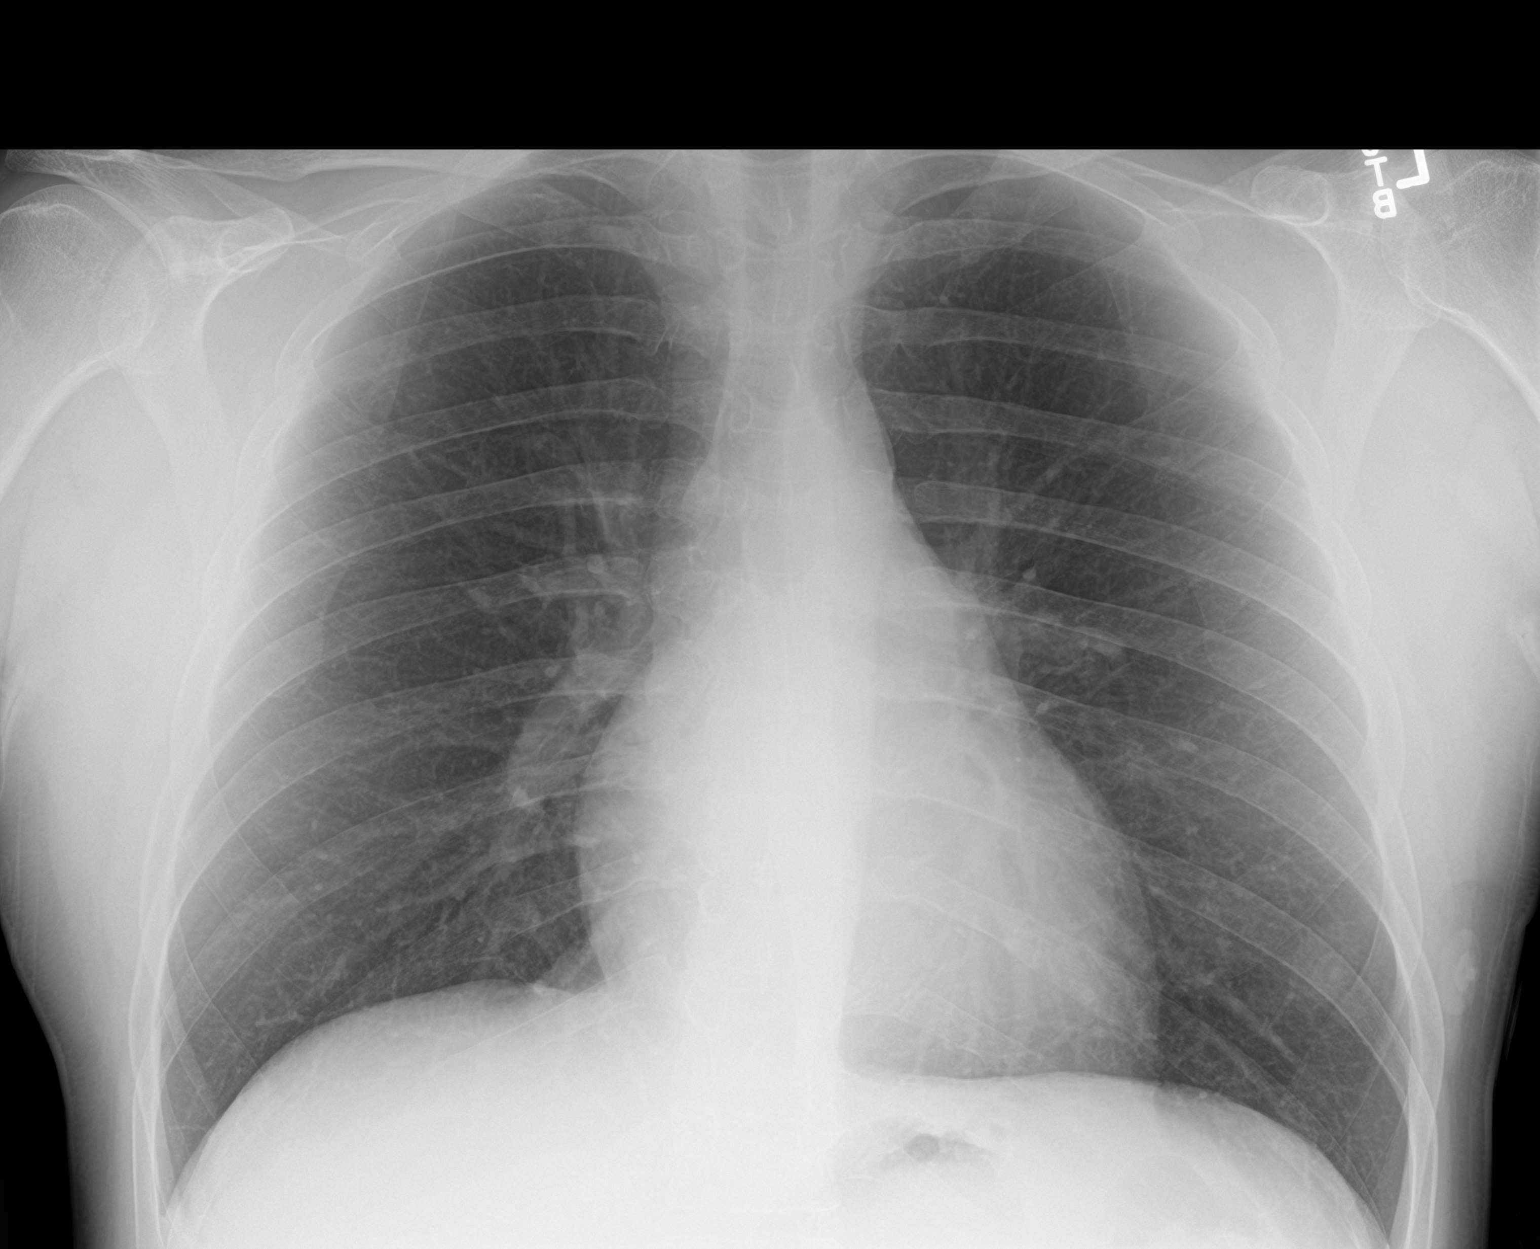

[2 of 2 positions shown; findings below may reference images not displayed]

FINDINGS: Heart and mediastinal contours are within normal limits. No focal
opacities or effusions. No acute bony abnormality.
IMPRESSION: No active cardiopulmonary disease.

## 2023-09-13 ENCOUNTER — Other Ambulatory Visit (HOSPITAL_BASED_OUTPATIENT_CLINIC_OR_DEPARTMENT_OTHER): Payer: Self-pay | Admitting: Orthopedic Surgery

## 2023-09-13 DIAGNOSIS — M25561 Pain in right knee: Secondary | ICD-10-CM

## 2023-09-21 ENCOUNTER — Ambulatory Visit (INDEPENDENT_AMBULATORY_CARE_PROVIDER_SITE_OTHER)
Admission: RE | Admit: 2023-09-21 | Discharge: 2023-09-21 | Disposition: A | Discharge status: Home / Self Care | Source: Ambulatory Visit | Attending: Orthopedic Surgery | Admitting: Orthopedic Surgery

## 2023-09-21 DIAGNOSIS — M25561 Pain in right knee: Secondary | ICD-10-CM | POA: Diagnosis not present
